# Patient Record
Sex: Female | Born: 1985 | Race: White | Hispanic: No | Marital: Married | Smoking: Never smoker
Health system: Southern US, Community
[De-identification: ages and names within clinical notes are randomized; demographics above are authoritative.]

## PROBLEM LIST (undated history)

## (undated) HISTORY — PX: HAND SURGERY: SHX662

---

## 2012-03-14 ENCOUNTER — Inpatient Hospital Stay: Payer: Self-pay | Admitting: Obstetrics and Gynecology

## 2012-03-14 LAB — CBC WITH DIFFERENTIAL/PLATELET
Basophil #: 0 10*3/uL (ref 0.0–0.1)
Eosinophil #: 0.1 10*3/uL (ref 0.0–0.7)
Eosinophil %: 0.8 %
HGB: 10 g/dL — ABNORMAL LOW (ref 12.0–16.0)
Lymphocyte %: 17 %
MCH: 25.3 pg — ABNORMAL LOW (ref 26.0–34.0)
MCHC: 32.3 g/dL (ref 32.0–36.0)
Monocyte #: 0.6 x10 3/mm (ref 0.2–0.9)
Neutrophil %: 75.8 %
RDW: 14.2 % (ref 11.5–14.5)

## 2012-03-17 LAB — CBC WITH DIFFERENTIAL/PLATELET
Eosinophil #: 0 10*3/uL (ref 0.0–0.7)
Eosinophil %: 0.1 %
Lymphocyte #: 1.7 10*3/uL (ref 1.0–3.6)
MCHC: 33.4 g/dL (ref 32.0–36.0)
MCV: 78 fL — ABNORMAL LOW (ref 80–100)
Monocyte #: 0.9 x10 3/mm (ref 0.2–0.9)
Monocyte %: 8.5 %
Neutrophil %: 75.8 %
Platelet: 275 10*3/uL (ref 150–440)
RBC: 3.55 10*6/uL — ABNORMAL LOW (ref 3.80–5.20)
RDW: 14.2 % (ref 11.5–14.5)
WBC: 11 10*3/uL (ref 3.6–11.0)

## 2012-03-17 LAB — BETA STREP CULTURE(ARMC)

## 2013-06-15 ENCOUNTER — Emergency Department: Payer: Self-pay | Admitting: Emergency Medicine

## 2013-06-15 LAB — COMPREHENSIVE METABOLIC PANEL
Albumin: 3.3 g/dL — ABNORMAL LOW (ref 3.4–5.0)
Alkaline Phosphatase: 71 U/L
Anion Gap: 1 — ABNORMAL LOW (ref 7–16)
BILIRUBIN TOTAL: 0.4 mg/dL (ref 0.2–1.0)
BUN: 11 mg/dL (ref 7–18)
CALCIUM: 8.9 mg/dL (ref 8.5–10.1)
CO2: 27 mmol/L (ref 21–32)
CREATININE: 0.76 mg/dL (ref 0.60–1.30)
Chloride: 105 mmol/L (ref 98–107)
EGFR (African American): >60
Glucose: 110 mg/dL — ABNORMAL HIGH (ref 65–99)
Osmolality: 266 (ref 275–301)
POTASSIUM: 4.3 mmol/L (ref 3.5–5.1)
SGOT(AST): 30 U/L (ref 15–37)
SGPT (ALT): 38 U/L (ref 12–78)
Sodium: 133 mmol/L — ABNORMAL LOW (ref 136–145)
TOTAL PROTEIN: 7.5 g/dL (ref 6.4–8.2)

## 2013-06-15 LAB — URINALYSIS, COMPLETE
BILIRUBIN, UR: NEGATIVE
Glucose,UR: NEGATIVE mg/dL (ref 0–75)
Ketone: NEGATIVE
Nitrite: NEGATIVE
PH: 6 (ref 4.5–8.0)
PROTEIN: NEGATIVE
RBC,UR: 10 /HPF (ref 0–5)
Specific Gravity: 1.004 (ref 1.003–1.030)
Squamous Epithelial: <1
WBC UR: 422 /HPF (ref 0–5)

## 2013-06-15 LAB — CBC
HCT: 36 % (ref 35.0–47.0)
HGB: 12.3 g/dL (ref 12.0–16.0)
MCH: 28.9 pg (ref 26.0–34.0)
MCHC: 34.1 g/dL (ref 32.0–36.0)
MCV: 85 fL (ref 80–100)
PLATELETS: 266 10*3/uL (ref 150–440)
RBC: 4.25 10*6/uL (ref 3.80–5.20)
RDW: 12.6 % (ref 11.5–14.5)
WBC: 9.3 10*3/uL (ref 3.6–11.0)

## 2013-06-17 LAB — URINE CULTURE

## 2014-02-01 LAB — OB RESULTS CONSOLE HGB/HCT, BLOOD
HCT: 39 %
HEMOGLOBIN: 13.1 g/dL

## 2014-02-01 LAB — OB RESULTS CONSOLE ABO/RH: RH Type: NEGATIVE

## 2014-02-01 LAB — OB RESULTS CONSOLE ANTIBODY SCREEN: ANTIBODY SCREEN: NEGATIVE

## 2014-02-01 LAB — OB RESULTS CONSOLE HEPATITIS B SURFACE ANTIGEN: Hepatitis B Surface Ag: NEGATIVE

## 2014-02-01 LAB — OB RESULTS CONSOLE GC/CHLAMYDIA
Chlamydia: NEGATIVE
Gonorrhea: NEGATIVE

## 2014-02-01 LAB — OB RESULTS CONSOLE RUBELLA ANTIBODY, IGM: Rubella: IMMUNE

## 2014-02-01 LAB — OB RESULTS CONSOLE VARICELLA ZOSTER ANTIBODY, IGG: Varicella: IMMUNE

## 2014-02-01 LAB — OB RESULTS CONSOLE RPR: RPR: NONREACTIVE

## 2014-02-01 LAB — OB RESULTS CONSOLE PLATELET COUNT: PLATELETS: 289 10*3/uL

## 2014-02-01 LAB — OB RESULTS CONSOLE HIV ANTIBODY (ROUTINE TESTING): HIV: NONREACTIVE

## 2014-03-11 ENCOUNTER — Encounter: Payer: Self-pay | Admitting: Obstetrics & Gynecology

## 2014-04-23 NOTE — L&D Delivery Note (Signed)
Delivery Note At 2:34 PM a viable female was delivered via Vaginal, Spontaneous Delivery (Presentation: Left Occiput Anterior) APGAR: 8, 9 Infants name: Yolanda Cline   weight 7 lb 6.3 oz (3355 g).   Placenta status: Intact, Spontaneous.   Cord: 3 vessels with the following complications: None.   Anesthesia: Epidural  Episiotomy:  None  Lacerations: Periurethral;Labial Suture Repair: 3.0 vicryl Est. Blood Loss (mL): 200  Mom to postpartum.  Baby to Couplet care / Skin to Skin.  SVD of a live viable female in ROA position over an intact perineum. After delivery of the head, a body cord was noted to be loose and was delivered through. The infant was then placed on the maternal abdomen where the cord was clamped and cut by the fob after pulsation ceased. Apgars were 8/9. The placenta was then delivered via schultz mechanism and was intact with a 3-vessel cord. Pitocin was then started and the uterus was firm with fundal massage. EBL was ~200. The perineum was inspected and was found to be intact. A left periurethral/labial laceration was noted and was repaired with 3-0 vicryl and was hemostatic after repair. Mother and baby are stable and bonding well.   Jannet MantisSubudhi,  Oddie Bottger 09/06/2014, 4:30 PM

## 2014-04-30 IMAGING — US US OB FOLLOW-UP
1 series · 13 of 28 positions shown · non-contrast
Comparison: none

REASON FOR EXAM: Has PPROM at 33 weeks. Please do estimated fetal weight
and AFI
COMMENTS:   LMP: pregnant with LMP07/27/2011

[Series 1: us ob follow-up · 0.28mm/px · 13 of 57 slices shown]
[im 3/57]
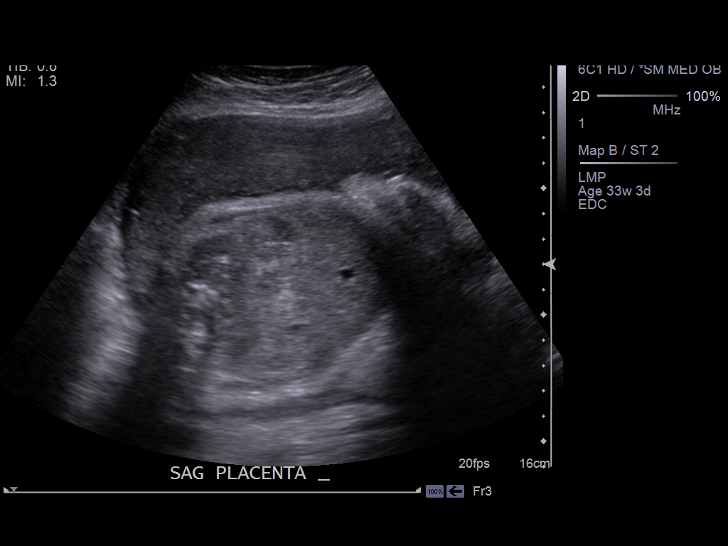
[im 7/57]
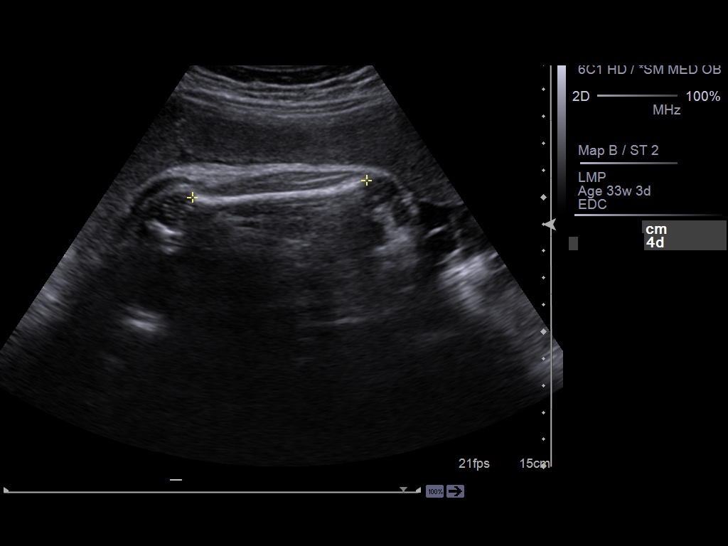
[im 11/57]
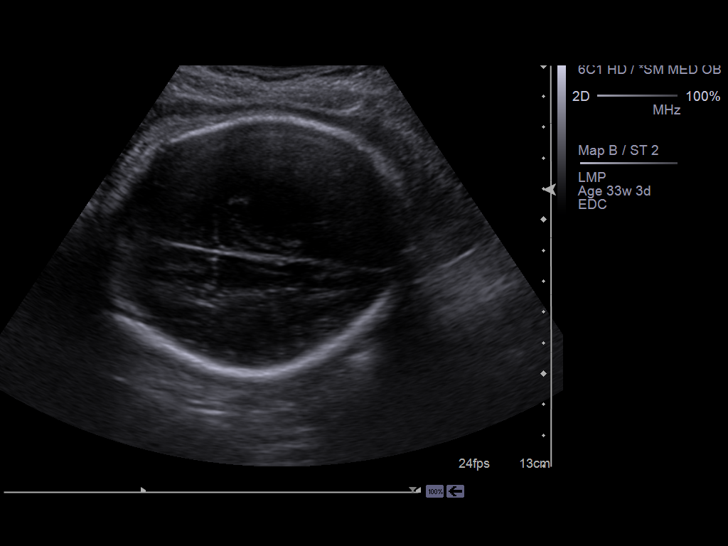
[im 15/57]
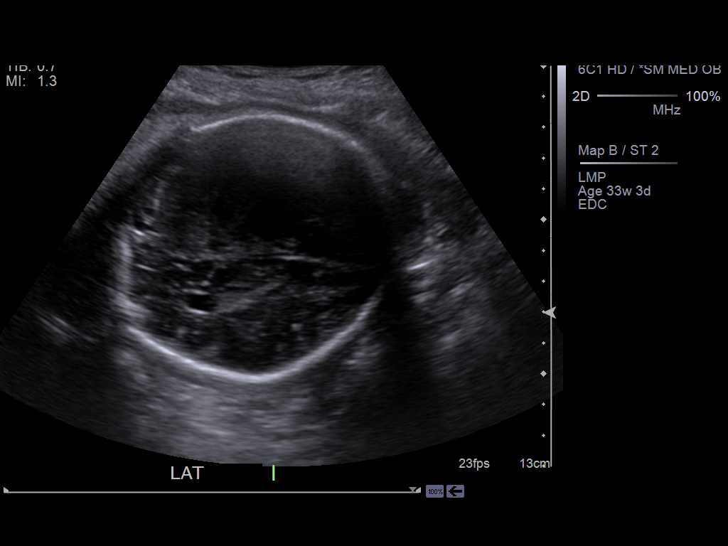
[im 19/57]
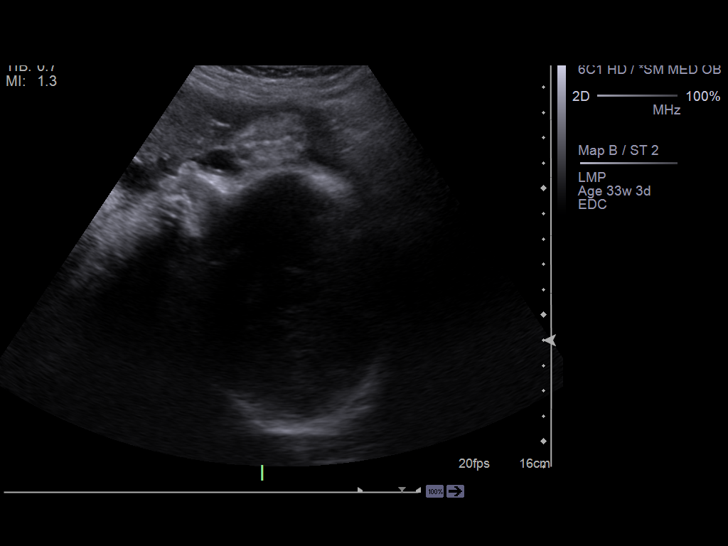
[im 23/57]
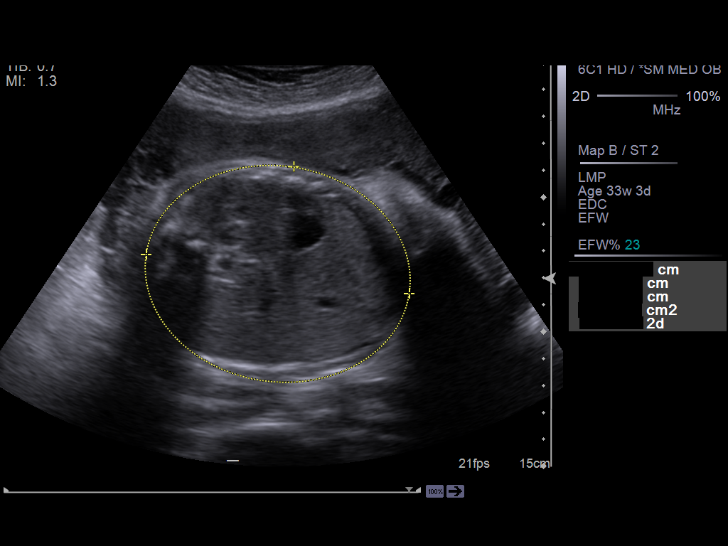
[im 30/57]
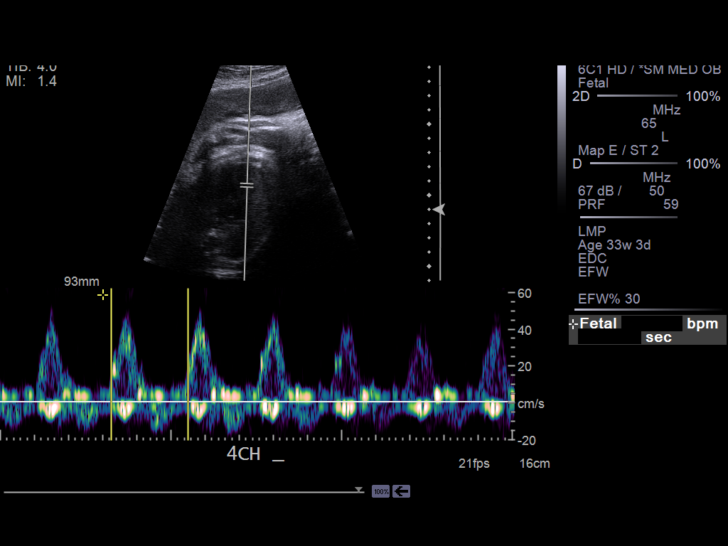
[im 34/57]
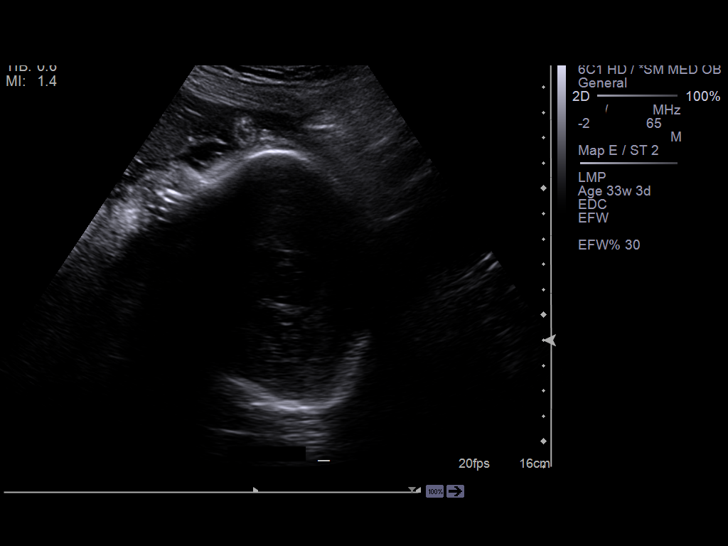
[im 38/57]
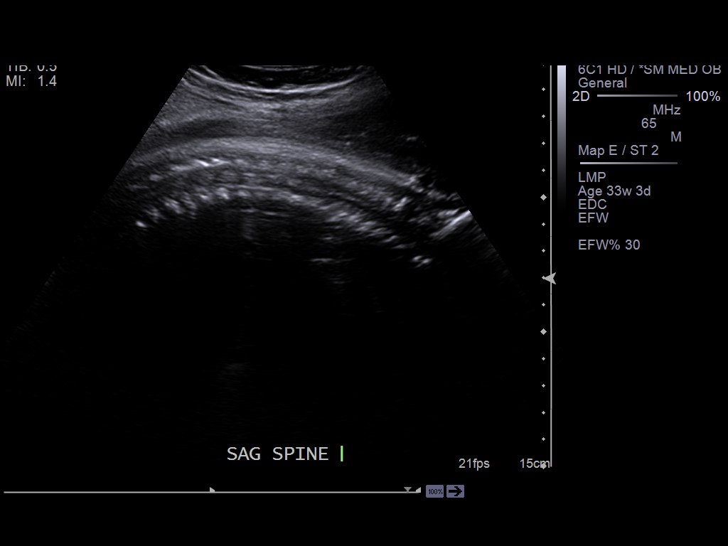
[im 42/57]
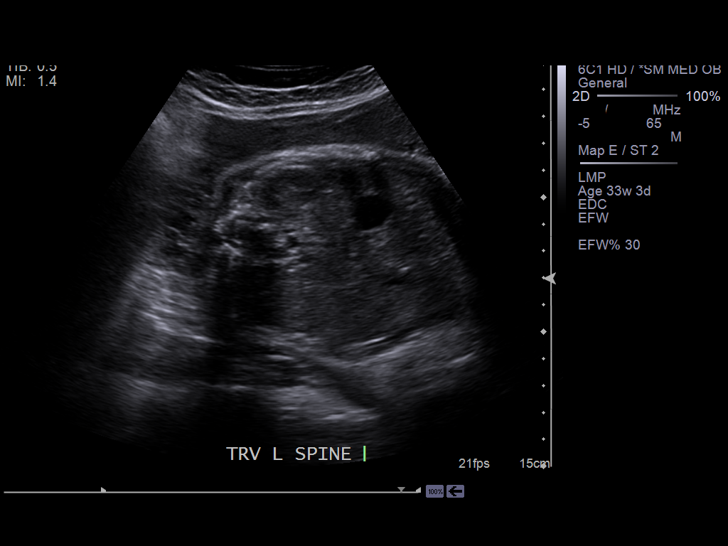
[im 46/57]
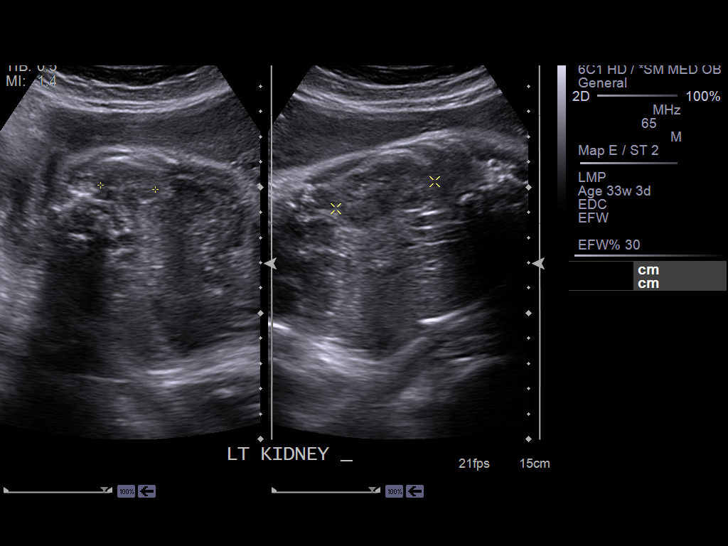
[im 50/57]
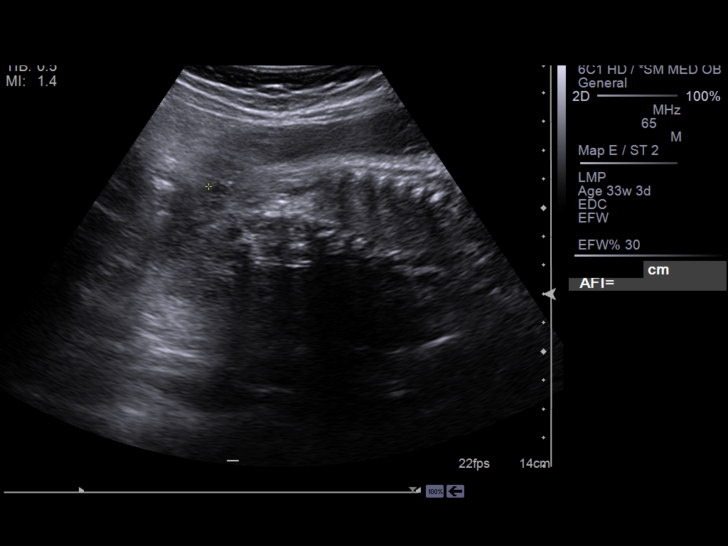
[im 54/57]
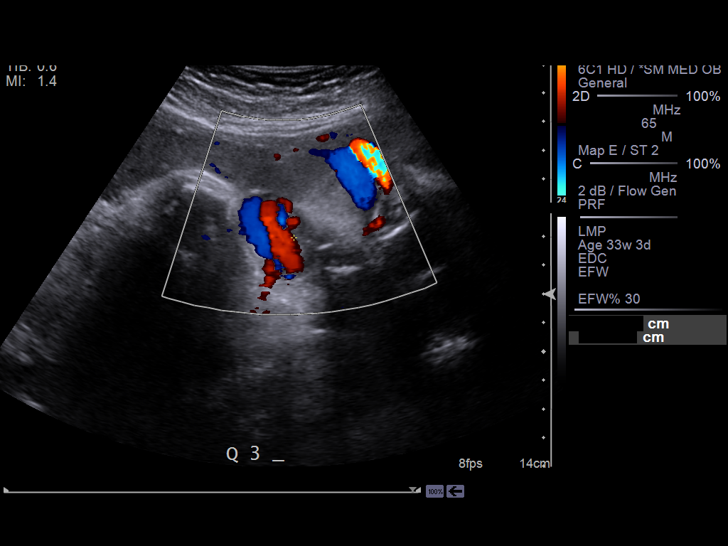

[13 of 28 positions shown; findings below may reference images not displayed]

PROCEDURE:     US  - US OB FOLLOW UP  - March 17, 2012 [DATE]

RESULT:     A limited third trimester ultrasound was performed for
assessment of amniotic fluid index and estimated fetal weight.

The amniotic fluid index measures 2.1 cm. The placenta is anterior and
partially fundal. The distance from the lower placental segment to the
internal cervical os is 9.5 cm. Survey views of fetal anatomy revealed a 4
chambered heart with a rate of 133 beats per minute. The fetal presentation
is cephalic.

Measured parameters:
BPD 8.34 cm corresponding to an EGA of 33 weeks 4 days
HC 29.70 cm corresponding to an EGA of 32 weeks 6 days
AC 28.92 cm corresponding to an EGA of 30 weeks 6 days
FL 6.42 cm corresponding to a 33 week 1 day gestation.
The estimated fetal weight is 3327 grams + / - 312 grams.
IMPRESSION: 1. There is a viable IUP with cephalic presentation. The estimated fetal
weight is 33 weeks 1 day + / - 21 days. The estimated date of confinement is
May 04, 2012. The estimated fetal weight is 3327 grams + / - 312 grams.
2. The amniotic fluid index is 2.1 cm.

[REDACTED]

## 2014-08-10 NOTE — Consult Note (Signed)
Gravida 2    Para 0    Term Deliveries 0    Preterm Deliveries 0    Abortions 1    Living Children 0    Final EDD (dd-mmm-yy) 02-May-2012    Blood Type (Maternal) O negative    Antibody Screen Results (Maternal) positive    HIV Results (Maternal) negative    Gonorrhea Results (Maternal) negative    Chlamydia Results (Maternal) negative    Hepatitis C Culture (Maternal) unknown    Herpes Results (Maternal) n/a    VDRL/RPR/Syphilis Results (Maternal) negative    Rubella Results (Maternal) immune    Hepatitis B Surface Antigen Results (Maternal) negative    Group B Strep Results Maternal (Result >5wks must be treated as unknown) unknown/result > 5 weeks ago  results pending, sent 11/22 at 1000am    Prenatal Care Adequate     Additional Comments This is a G1 mother at [redacted] wks GA with PROM and PTL. She is being treated with antibiotics and BMS I met woth both to be mom and dad and discussed at length about complications of prematurity at 33-[redacted] wks GA such as Respiratory Distress Symdrome, Feeding problems,. risk of sepsis / meningitis wit PROM, Need for IV fluids , PIV and OG feeds and also risk of jaundice. I also discussed about possible hospital length of stay for the baby. His name will be "Malachai"  I answered all their questions and offered to come back if any questions / anxiety arises.   Time of consultation ~25 min for face to face encounter adn reviewing the chart  Thank you for the consultation    Parental Contact Parents informed at length regarding prenatal care and plan   Electronic Signatures: Darreld Mclean (MD)  (Signed [phone] 17:01)  Authored: PREGNANCY and LABOR, ADDITIONAL COMMENTS   Last Updated: 22-Nov-13 17:01 by Darreld Mclean (MD)

## 2014-08-14 NOTE — Consult Note (Signed)
Referral Information:  Reason for Referral Currently pregnant. History of PPROM   Referring Physician [facility]   Prenatal Hx Yolanda Cline is a 29 year-old G3 P0111 at 66 1/7 weeks who presents for recommendations in this pregnancy.  In 2013, she had SROM at 33 weeks without signs of labor. Prior to that her pregnancy had been uncomplicated. She received antenatal corticosteroids and latency antibiotics. At 34 weeks, [facility] ICN was full so she was transferred to [facility] in [place] where she underwent an uncomplicated labor induction with pitocin and spontaneous vaginal delivery.  Her son weighed 5 pounds 3 ounces and was in the ICN for 8 days.  He is doing well.   She denies any complicatiosn with the current pregnancy. She is aware of 17P and has questions.  Denies vaginal bleeding.   Past Obstetrical Hx See above.  G3 [id number] 2012: First trimester SAB, treated with misoprostol. No heart beat ever seen 2013: PPROM at 33 wks with labor induction and spontaneous vaginal delivery at 34 weeks.  Unknown placental pathology.   Allergies:   No Known Allergies:   Vital Signs/Notes:  Nursing Vital Signs: **Vital Signs.:   19-Nov-15 15:02  Pulse Pulse 93  Systolic BP Systolic BP 108  Diastolic BP (mmHg) Diastolic BP (mmHg) 68   Perinatal Consult:  PGyn Hx Remote history of abnormal pap but no cervical procedures. 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   Fever/Chills No   Cough No   Abdominal Pain No   SOB/DOE No   Medications/Allergies Reviewed Medications/Allergies reviewed  taking Prenatal vitamins only    Additional Lab/Radiology Notes Prenatal labs ([facility] 02/01/14): Blood type O negative, antbody screen  negative, Varicella immune, Rubella immune, RPR non-reactive, Hep B neg, HIV non-reactive, hct 39.4, MCV 86, Plt 289, Urine culture no growth. GC/Chl Negative  First trimester screen ([facility] 03/02/14) Screen risk for Down Syndrome less than 1 in 10,000 Screen risk for Trisomy 18 less than 1 in 10,000   Impression/Recommendations:  Impression 29 year-old G3 P0111 at 73 1/7 weeks with history of prior pregnancy complicated by PPROM at 33 weeks and elective induction of labor at 34 weeks.   Recommendations We discussed the recurrence risk of preterm birth among women with prior preterm birth. We discussed the MFMU Meis trial and that she would be a candidate for 17P therapy.  Risks/benefits discussed.  Rec: -Initiate 17P at 15-16 weeks and continue weekly until 36 weeks -Cervical length assessment at time of anatomy scan at 18 weeks. Repeat cervical length screening at lease weekly until 28 weeks. Please contact us should her cervical length become less than 2.5 cm -We discussed signs and symptoms of preterm labor.    Total Time Spent with Patient 60 minutes   >50% of visit spent in couseling/coordination of care yes   Office Use Only 99244  Level 4 ( ) NEW office consult low complexity   Coding Description: OTHER: History of preterm birth due to PPROM.  Electronic Signatures for Addendum Section:  Laramie Meissner, [place] (MD) (Signed Addendum 22-Feb-16 10:50)  Append: Cervical length screening can be done every 1-2 weeks until 28 weeks.   Electronic Signatures: Tiah Heckel, [place] (MD)  (Signed [phone] 16:04)  Authored:  Referral, Home Medications, Allergies, Vital Signs/Notes, Consult, Exam, Lab/Radiology Notes, Impression, Billing, Coding Description   Last Updated: 22-Feb-16 10:50 by Doren Kaspar, [place] (MD)

## 2014-08-23 LAB — OB RESULTS CONSOLE GBS: STREP GROUP B AG: NEGATIVE

## 2014-08-31 NOTE — H&P (Signed)
L&D Evaluation:  History:   HPI 29 yo G2P0010 at 5432w0d gestational age who presents from clinic with grossly ruptured membranes confirmed ruptured in clinic with +pool, +nitrazine, +ferning.  She notes she had gush of clear fluid this morning.  She presented to a previously scheduled appt for assessment with the above-noted findings.  Her pregnancy has been uncomplicated so far.  She notes positive fetal movement, no vaginal bleeding, and only occasional contractions.  O neg, RPR NR, HIV neg, HBsAg neg, RI, VZI.  GBS unknown    Patient's Medical History No Chronic Illness    Patient's Surgical History hand surgery    Medications Pre Natal Vitamins    Allergies NKDA    Social History none    Family History Non-Contributory   ROS:   ROS All systems were reviewed.  HEENT, CNS, GI, GU, Respiratory, CV, Renal and Musculoskeletal systems were found to be normal., unless noted in HPI   Exam:   Vital Signs stable    General no apparent distress    Mental Status clear    Chest clear    Heart normal sinus rhythm    Abdomen gravid, non-tender    Estimated Fetal Weight Average for gestational age    Fetal Position cephalic by bedside u/s    Back no CVAT    Edema no edema    Pelvic no external lesions, closed by inspection at office this AM    Mebranes Ruptured    Description clear    FHT normal rate with no decels    FHT Description 140/mod var/+accels/no decels   Impression:   Impression PPROM, reactive NST   Plan:   Plan EFM/NST, monitor contractions and for cervical change    Comments - Will give betamethasone for fetal lung maturity - latency antibiotics start today.  Ampicillin 2g Q6 hours x 48 hours, Erythromycin 250mg  Q6h x 48 hours (IV antibiotics for the first two days).  Will switch to PO amoxicillin and erythromycin days 3-7.   - GBS collected in clinic today, will send to lab. - neonatology consultation -expectant management until 34 weeks, then will  induce - will monitor for signs/symptoms of chorioamnionitis, abruption, labor.  - spent extended period of time with patient and her husband discussing plan of management.  All questions answered. - Will no tocolize if labors.   Electronic Signatures: Conard NovakJackson, Aladdin Kollmann D (MD)  (Signed [phone]-Nov-13 20:42)  Authored: L&D Evaluation   Last Updated: 24-Nov-13 20:42 by Conard NovakJackson, Camil Wilhelmsen D (MD)

## 2014-09-05 ENCOUNTER — Inpatient Hospital Stay
Admission: EM | Admit: 2014-09-05 | Discharge: 2014-09-07 | DRG: 775 | Disposition: A | Discharge status: Home / Self Care | Payer: BC Managed Care – PPO | Attending: Obstetrics and Gynecology | Admitting: Obstetrics and Gynecology

## 2014-09-05 DIAGNOSIS — Z3A38 38 weeks gestation of pregnancy: Secondary | ICD-10-CM | POA: Diagnosis present

## 2014-09-05 DIAGNOSIS — Z3483 Encounter for supervision of other normal pregnancy, third trimester: Secondary | ICD-10-CM | POA: Diagnosis present

## 2014-09-05 DIAGNOSIS — O479 False labor, unspecified: Secondary | ICD-10-CM

## 2014-09-05 LAB — TYPE AND SCREEN
ABO/RH(D): O NEG
Antibody Screen: NEGATIVE

## 2014-09-05 LAB — RAPID HIV SCREEN (HIV 1/2 AB+AG)
HIV 1/2 ANTIBODIES: NONREACTIVE
HIV-1 P24 ANTIGEN - HIV24: NONREACTIVE

## 2014-09-05 LAB — CBC
HEMATOCRIT: 30.8 % — AB (ref 35.0–47.0)
Hemoglobin: 9.8 g/dL — ABNORMAL LOW (ref 12.0–16.0)
MCH: 24.2 pg — ABNORMAL LOW (ref 26.0–34.0)
MCHC: 31.9 g/dL — ABNORMAL LOW (ref 32.0–36.0)
MCV: 75.8 fL — AB (ref 80.0–100.0)
Platelets: 221 10*3/uL (ref 150–440)
RBC: 4.06 MIL/uL (ref 3.80–5.20)
RDW: 16.6 % — ABNORMAL HIGH (ref 11.5–14.5)
WBC: 9.4 10*3/uL (ref 3.6–11.0)

## 2014-09-05 MED ORDER — OXYTOCIN 40 UNITS IN LACTATED RINGERS INFUSION - SIMPLE MED
INTRAVENOUS | Status: AC
Start: 2014-09-05 — End: 2014-09-06
  Filled 2014-09-05: qty 1000

## 2014-09-05 MED ORDER — ACETAMINOPHEN 325 MG PO TABS: 650.0000 mg | ORAL_TABLET | ORAL | Status: DC | PRN

## 2014-09-05 MED ORDER — BUTORPHANOL TARTRATE 1 MG/ML IJ SOLN
2.0000 mg | INTRAMUSCULAR | Status: DC | PRN
  Administered 2014-09-06: 2 mg via INTRAVENOUS

## 2014-09-05 MED ORDER — OXYTOCIN 40 UNITS IN LACTATED RINGERS INFUSION - SIMPLE MED: 62.5000 mL/h | INTRAVENOUS | Status: DC

## 2014-09-05 MED ORDER — LACTATED RINGERS IV SOLN: INTRAVENOUS | Status: DC

## 2014-09-05 MED ORDER — OXYTOCIN BOLUS FROM INFUSION
500.0000 mL | INTRAVENOUS | Status: DC
  Administered 2014-09-06: 500 mL via INTRAVENOUS

## 2014-09-05 MED ORDER — SODIUM CHLORIDE 0.9 % IJ SOLN
INTRAMUSCULAR | Status: AC
  Administered 2014-09-05: 3 mL via INTRAVENOUS
  Filled 2014-09-05: qty 50

## 2014-09-05 MED ORDER — LACTATED RINGERS IV SOLN: 500.0000 mL | INTRAVENOUS | Status: DC | PRN

## 2014-09-05 MED ORDER — SODIUM CHLORIDE 0.9 % IJ SOLN
3.0000 mL | INTRAMUSCULAR | Status: DC | PRN
  Administered 2014-09-05: 3 mL via INTRAVENOUS
  Filled 2014-09-05: qty 10

## 2014-09-05 NOTE — H&P (Signed)
Obstetric History and Physical  Yolanda Cline is a 29 y.o. [phone] with Estimated Date of Delivery: 09/16/14 per LMP and 7 wk US who presents at 5218w3d  presenting for contractions. Patient states she has been having  regular, every 5 minutes contractions, no vaginal bleeding, intact membranes, with active fetal movement.    Prenatal Course Source of Care: [facility]  with onset of care at 7 weeks Pregnancy complications or risks: Patient Active Problem List   Diagnosis Date Noted  . Irregular contractions 09/05/2014  . Labor and delivery, indication for care 09/05/2014   She plans to breastfeed She desires unknown for postpartum contraception.   Prenatal labs and studies: ABO, Rh: O neg Antibody: neg Rubella: Immune Varicella: Immune RPR:  NR HBsAg:  Neg HIV: neg GC/CT: neg/neg GBS: neg 1 hr Glucola: 137   Genetic screening: 1st trimester negative     Prenatal Transfer Tool   No past medical history on file.  Past Surgical History  Procedure Laterality Date  . Hand surgery      OB History  Gravida Para Term Preterm AB SAB TAB Ectopic Multiple Living  3 1  1 1 1    1     # Outcome Date GA Lbr Len/2nd Weight Sex Delivery Anes PTL Lv  3 Current           2 Preterm 03/20/12 692w0d  2.353 kg (5 lb 3 oz) M Vag-Spont None Y Y  1 SAB 09/06/10            Obstetric Comments  H/O preterm labor with 2nd pregnancy.    History   Social History  . Marital Status: Married    Spouse Name: N/A  . Number of Children: N/A  . Years of Education: N/A   Social History Main Topics  . Smoking status: Never Smoker   . Smokeless tobacco: Not on file  . Alcohol Use: No  . Drug Use: No  . Sexual Activity: Yes   Other Topics Concern  . None   Social History Narrative  . None    Family History  Problem Relation Age of Onset  . Cancer Maternal Grandmother   . Hypertension Maternal Grandmother   . Hypertension Paternal Grandfather     Prescriptions prior to admission   Medication Sig Dispense Refill Last Dose  . Prenatal Vit-Fe Fumarate-FA (PRENATAL MULTIVITAMIN) TABS tablet Take 1 tablet by mouth daily at 12 noon.       No Known Allergies  Review of Systems: Negative except for what is mentioned in HPI.  Physical Exam: BP 126/86 mmHg  Pulse 115  Temp(Src) 97.7 F (36.5 C) (Oral)  Resp 18  Ht 5' (1.524 m)  Wt 68.493 kg (151 lb)  BMI 29.49 kg/m2  LMP 12/10/2013 GENERAL: Well-developed, well-nourished female in no acute distress.  LUNGS: Clear to auscultation bilaterally.  HEART: Regular rate and rhythm. ABDOMEN: Soft, nontender, nondistended, gravid. EXTREMITIES: Nontender, no edema Cervical Exam: Dilatation 4.5 cm   Effacement 90 %   Station 0 to -1, initially 2 cm on admission, 4 cm at most recent exam at  1800  Presentation: cephalic FHT: Category:1 Baseline rate 130 bpm   Variability moderate  Accelerations present   Decelerations none Contractions: Every 6-7 mins   Pertinent Labs/Studies:   Results for orders placed or performed during the hospital encounter of 09/05/14 (from the past 24 hour(s))  CBC     Status: Abnormal   Collection Time: 09/05/14  8:10 PM  Result Value  Ref Range   WBC 9.4 3.6 - 11.0 K/uL   RBC 4.06 3.80 - 5.20 MIL/uL   Hemoglobin 9.8 (L) 12.0 - 16.0 g/dL   HCT 16.130.8 (L) 09.635.0 - 04.547.0 %   MCV 75.8 (L) 80.0 - 100.0 fL   MCH 24.2 (L) 26.0 - 34.0 pg   MCHC 31.9 (L) 32.0 - 36.0 g/dL   RDW 40.916.6 (H) 81.111.5 - 91.414.5 %   Platelets 221 150 - 440 K/uL  Rapid HIV screen (HIV 1/2 Ab+Ag)     Status: None   Collection Time: 09/05/14  8:10 PM  Result Value Ref Range   HIV-1 P24 Antigen - HIV24 NON REACTIVE NON REACTIVE   HIV 1/2 Antibodies NON REACTIVE NON REACTIVE   Interpretation (HIV Ag Ab)      A non reactive test result means that HIV 1 or HIV 2 antibodies and HIV 1 p24 antigen were not detected in the specimen.  Type and screen     Status: None (Preliminary result)   Collection Time: 09/05/14  8:10 PM  Result Value Ref  Range   ABO/RH(D) PENDING    Antibody Screen PENDING    Sample Expiration 09/08/2014     Assessment : IUP at 7463w3d being admitted for labor.  Plan: Continue monitoring for labor progress Reviewed expecting management at 38 weeks.  Intermittent monitoring

## 2014-09-05 NOTE — OB Triage Note (Signed)
29 y.o. female presents today with complaint of contractions since this afternoon .  States that this started around 1245. This has been going on for a few hours where her ctx have been 5 minutes apart. States that this pain ranges from a 2 /10 to a 6/10 on pain scale. She states that nothing makes it better and walking makes it worse. At home has tried a warm shower to make it better with no success.  At her last office visit on Friday she was checked by Dr. Vergie LivingPickens and told she was 1/50/-1.  Has a h/o preterm delivery with her first pregnancy and when she started into labor it went rather quickly, so she is concerned about going quickly this time.

## 2014-09-05 NOTE — Progress Notes (Signed)
L&D Note  09/05/2014 - 11:35 PM  29 y.o. [id number] 3293w3d   [name] is admitted for labor   Subjective:  Reports contractions increasing in intensity  Objective:   Filed Vitals:   09/05/14 1544 09/05/14 1605 09/05/14 1606 09/05/14 1929  BP:  116/75  126/86  Pulse:  123 118 115  Temp:  98.4 F (36.9 C)  97.7 F (36.5 C)  TempSrc:  Oral  Oral  Resp:  16  18  Height: 5' (1.524 m)     Weight: 68.493 kg (151 lb)       Current Vital Signs 24h Vital Sign Ranges  T 97.7 F (36.5 C) Temp  Avg: 98.1 F (36.7 C)  Min: 97.7 F (36.5 C)  Max: 98.4 F (36.9 C)  BP 126/86 mmHg BP  Min: 116/75  Max: 126/86  HR (!) 115 Pulse  Avg: 118.7  Min: 115  Max: 123  RR 18 Resp  Avg: 17  Min: 16  Max: 18  SaO2     No Data Recorded       24 Hour I/O Current Shift I/O  Time Ins Outs        FHR: baseline 115, mod var, + accels Toco: not graphing well SVE: 5/90/-1   Assessment :  IUP at 6793w3d in labor    Plan:  Continue expectant management Stadol then epidural when desired Anticipate vaginal delivery   Marta AntuBrothers, Marielouise Amey, [place]

## 2014-09-06 ENCOUNTER — Encounter: Payer: Self-pay | Admitting: *Deleted

## 2014-09-06 ENCOUNTER — Inpatient Hospital Stay: Payer: BC Managed Care – PPO | Admitting: Anesthesiology

## 2014-09-06 LAB — ABO/RH: ABO/RH(D): O NEG

## 2014-09-06 MED ORDER — DIBUCAINE 1 % RE OINT: 1.0000 "application " | TOPICAL_OINTMENT | RECTAL | Status: DC | PRN

## 2014-09-06 MED ORDER — OXYCODONE-ACETAMINOPHEN 5-325 MG PO TABS: 2.0000 | ORAL_TABLET | ORAL | Status: DC | PRN

## 2014-09-06 MED ORDER — OXYTOCIN 40 UNITS IN LACTATED RINGERS INFUSION - SIMPLE MED
1.0000 m[IU]/min | INTRAVENOUS | Status: DC
  Administered 2014-09-06: 1 m[IU]/min via INTRAVENOUS

## 2014-09-06 MED ORDER — BENZOCAINE-MENTHOL 20-0.5 % EX AERO: 1.0000 "application " | INHALATION_SPRAY | CUTANEOUS | Status: DC | PRN

## 2014-09-06 MED ORDER — ONDANSETRON HCL 4 MG/2ML IJ SOLN: 4.0000 mg | INTRAMUSCULAR | Status: DC | PRN

## 2014-09-06 MED ORDER — BUTORPHANOL TARTRATE 1 MG/ML IJ SOLN
INTRAMUSCULAR | Status: AC
  Administered 2014-09-06: 2 mg via INTRAVENOUS
  Filled 2014-09-06: qty 2

## 2014-09-06 MED ORDER — BENZOCAINE-MENTHOL 20-0.5 % EX AERO
INHALATION_SPRAY | CUTANEOUS | Status: AC
  Filled 2014-09-06: qty 56

## 2014-09-06 MED ORDER — ROPIVACAINE HCL 2 MG/ML IJ SOLN: 10.0000 mL/h | INTRAMUSCULAR | Status: DC

## 2014-09-06 MED ORDER — CALCIUM CARBONATE ANTACID 500 MG PO CHEW: 500.0000 mg | CHEWABLE_TABLET | ORAL | Status: DC | PRN

## 2014-09-06 MED ORDER — OXYCODONE-ACETAMINOPHEN 5-325 MG PO TABS: 1.0000 | ORAL_TABLET | ORAL | Status: DC | PRN

## 2014-09-06 MED ORDER — SENNOSIDES-DOCUSATE SODIUM 8.6-50 MG PO TABS
2.0000 | ORAL_TABLET | ORAL | Status: DC
  Administered 2014-09-07: 2 via ORAL
  Filled 2014-09-06: qty 2

## 2014-09-06 MED ORDER — METHYLERGONOVINE MALEATE 0.2 MG/ML IJ SOLN
0.2000 mg | INTRAMUSCULAR | Status: DC | PRN
  Filled 2014-09-06: qty 1

## 2014-09-06 MED ORDER — TERBUTALINE SULFATE 1 MG/ML IJ SOLN: 0.2500 mg | Freq: Once | INTRAMUSCULAR | Status: DC | PRN

## 2014-09-06 MED ORDER — PRENATAL MULTIVITAMIN CH
1.0000 | ORAL_TABLET | Freq: Every day | ORAL | Status: DC
  Administered 2014-09-07: 1 via ORAL
  Filled 2014-09-06: qty 1

## 2014-09-06 MED ORDER — FENTANYL 2.5 MCG/ML W/ROPIVACAINE 0.2% IN NS 100 ML EPIDURAL INFUSION (ARMC-ANES)
EPIDURAL | Status: AC
  Filled 2014-09-06: qty 100

## 2014-09-06 MED ORDER — OXYTOCIN 40 UNITS IN LACTATED RINGERS INFUSION - SIMPLE MED: 62.5000 mL/h | Freq: Once | INTRAVENOUS | Status: DC

## 2014-09-06 MED ORDER — IBUPROFEN 200 MG PO TABS
600.0000 mg | ORAL_TABLET | Freq: Four times a day (QID) | ORAL | Status: DC | PRN
  Administered 2014-09-06 – 2014-09-07 (×4): 600 mg via ORAL
  Filled 2014-09-06: qty 3

## 2014-09-06 MED ORDER — WITCH HAZEL-GLYCERIN EX PADS: 1.0000 "application " | MEDICATED_PAD | CUTANEOUS | Status: DC | PRN

## 2014-09-06 MED ORDER — BUPIVACAINE HCL (PF) 0.25 % IJ SOLN
INTRAMUSCULAR | Status: DC | PRN
  Administered 2014-09-06: 10 mL

## 2014-09-06 MED ORDER — LANOLIN HYDROUS EX OINT: TOPICAL_OINTMENT | CUTANEOUS | Status: DC | PRN

## 2014-09-06 MED ORDER — ONDANSETRON HCL 4 MG PO TABS: 4.0000 mg | ORAL_TABLET | ORAL | Status: DC | PRN

## 2014-09-06 MED ORDER — ACETAMINOPHEN 325 MG PO TABS: 650.0000 mg | ORAL_TABLET | ORAL | Status: DC | PRN

## 2014-09-06 MED ORDER — TETANUS-DIPHTH-ACELL PERTUSSIS 5-2.5-18.5 LF-MCG/0.5 IM SUSP
0.5000 mL | Freq: Once | INTRAMUSCULAR | Status: DC
  Filled 2014-09-06: qty 0.5

## 2014-09-06 MED ORDER — SIMETHICONE 80 MG PO CHEW: 80.0000 mg | CHEWABLE_TABLET | ORAL | Status: DC | PRN

## 2014-09-06 MED ORDER — FENTANYL 2.5 MCG/ML W/ROPIVACAINE 0.2% IN NS 100 ML EPIDURAL INFUSION (ARMC-ANES)
10.0000 mL/h | EPIDURAL | Status: DC
  Administered 2014-09-06: 10 mL/h via EPIDURAL

## 2014-09-06 MED ORDER — ZOLPIDEM TARTRATE 5 MG PO TABS: 5.0000 mg | ORAL_TABLET | Freq: Every evening | ORAL | Status: DC | PRN

## 2014-09-06 MED ORDER — METHYLERGONOVINE MALEATE 0.2 MG PO TABS: 0.2000 mg | ORAL_TABLET | ORAL | Status: DC | PRN

## 2014-09-06 MED ORDER — DIPHENHYDRAMINE HCL 25 MG PO CAPS: 25.0000 mg | ORAL_CAPSULE | Freq: Four times a day (QID) | ORAL | Status: DC | PRN

## 2014-09-06 MED ORDER — LIDOCAINE-EPINEPHRINE (PF) 1.5 %-1:200000 IJ SOLN
INTRAMUSCULAR | Status: DC | PRN
  Administered 2014-09-06: 3 mL

## 2014-09-06 NOTE — Anesthesia Procedure Notes (Signed)
Epidural Patient location during procedure: OB Start time: 09/06/2014 9:40 AM End time: 09/06/2014 9:48 AM  Staffing Anesthesiologist: Yves DillARROLL, Teancum Brule  Preanesthetic Checklist Completed: patient identified, site marked, surgical consent, pre-op evaluation, timeout performed, IV checked, risks and benefits discussed and monitors and equipment checked  Epidural Patient position: sitting Prep: Betadine Patient monitoring: heart rate, cardiac monitor, continuous pulse ox and blood pressure Approach: midline Location: L3-L4 Injection technique: LOR air  Needle:  Needle type: Tuohy  Needle gauge: 18 G Needle length: 9 cm Needle insertion depth: 5 cm Catheter type: closed end flexible Catheter size: 20 Guage Test dose: negative and 1.5% lidocaine with Epi 1:200 K  Assessment Sensory level: T8  Additional Notes Reason for block:procedure for pain

## 2014-09-06 NOTE — Progress Notes (Signed)
L&D Note  09/06/2014 - 8:40 AM  29 y.o. [id number] 2035w4d here for labor. Pregnancy complicated by history of preterm birth and was on 17-p.   Patient Active Problem List   Diagnosis Date Noted  . Irregular contractions 09/05/2014  . Labor and delivery, indication for care 09/05/2014    [name] is admitted for labor   Subjective:  Pt reports having painful contractions- received stadol recently for pain.   Objective:   Filed Vitals:   09/06/14 0039 09/06/14 0222 09/06/14 0520 09/06/14 0733  BP: 122/81 114/79 137/81 117/73  Pulse: 99 94 94 98  Temp:    98.1 F (36.7 C)  TempSrc:    Oral  Resp:    16  Height:      Weight:        Current Vital Signs 24h Vital Sign Ranges  T 98.1 F (36.7 C) Temp  Avg: 98.1 F (36.7 C)  Min: 97.7 F (36.5 C)  Max: 98.4 F (36.9 C)  BP 117/73 mmHg BP  Min: 114/79  Max: 137/81  HR 98 Pulse  Avg: 106.9  Min: 94  Max: 123  RR 16 Resp  Avg: 17  Min: 16  Max: 18  SaO2     No Data Recorded       24 Hour I/O Current Shift I/O  Time Ins Outs        FHR: 125, moderate variability, +accels, no decels  Toco: Q6 min SVE: last exam- Dilation: 5 Effacement (%): 90 Cervical Position: Anterior Station: -1 Presentation: Vertex Exam by:: Brothers CNM Labs:   Recent Labs Lab 09/05/14 2010  WBC 9.4  HGB 9.8*  HCT 30.8*  PLT 221   No results for input(s): NA, K, CL, CO2, BUN, CREATININE, CALCIUM, PROT, BILITOT, ALKPHOS, ALT, AST, GLUCOSE in the last 168 hours.  Invalid input(s): LABALBU  Medications   Medication List    ASK your doctor about these medications        prenatal multivitamin Tabs tablet  Take 1 tablet by mouth daily at 12 noon.       Assessment & Plan:   *IUP: 5635w4d admitted for labor *GBS: negative *Analgesia: stadol currently- plans epidural for pain relief  Jannet Mantisourtney Keziah Avis, CNM [facility]

## 2014-09-06 NOTE — Anesthesia Preprocedure Evaluation (Signed)
Anesthesia Evaluation  Patient identified by MRN, date of birth, ID band Patient awake    Airway Mallampati: II  TM Distance: >3 FB Neck ROM: Full    Dental no notable dental hx.    Pulmonary neg pulmonary ROS,    Pulmonary exam normal       Cardiovascular negative cardio ROS Normal cardiovascular exam    Neuro/Psych    GI/Hepatic negative GI ROS, Neg liver ROS,   Endo/Other  negative endocrine ROS  Renal/GU negative Renal ROS  negative genitourinary   Musculoskeletal negative musculoskeletal ROS (+)   Abdominal Normal abdominal exam  (+)   Peds negative pediatric ROS (+)  Hematology negative hematology ROS (+)   Anesthesia Other Findings   Reproductive/Obstetrics (+) Pregnancy                             Anesthesia Physical Anesthesia Plan  ASA: II  Anesthesia Plan: Epidural   Post-op Pain Management:    Induction: Intravenous  Airway Management Planned:   Additional Equipment:   Intra-op Plan:   Post-operative Plan:   Informed Consent: I have reviewed the patients History and Physical, chart, labs and discussed the procedure including the risks, benefits and alternatives for the proposed anesthesia with the patient or authorized representative who has indicated his/her understanding and acceptance.   Dental advisory given  Plan Discussed with: Surgeon  Anesthesia Plan Comments:         Anesthesia Quick Evaluation

## 2014-09-06 NOTE — Progress Notes (Signed)
Delivered SVD.   Yolanda Cline

## 2014-09-06 NOTE — Progress Notes (Signed)
L&D Note  09/06/2014 - 5:39 AM  29 y.o. [id number] 839w4d   Ms. Altamease Oilerlizabeth A Couzens is admitted for labor   Subjective:  Pt has continue to have regular contractions, periods of irregular and regular contractions   Objective:   Filed Vitals:   09/05/14 2104 09/05/14 2236 09/06/14 0039 09/06/14 0222  BP: 137/73 127/56 122/81 114/79  Pulse: 107 114 99 94  Temp:  98.2 F (36.8 C)    TempSrc:  Oral    Resp:  18    Height:      Weight:        Current Vital Signs 24h Vital Sign Ranges  T 98.2 F (36.8 C) Temp  Avg: 98.1 F (36.7 C)  Min: 97.7 F (36.5 C)  Max: 98.4 F (36.9 C)  BP 114/79 mmHg BP  Min: 114/79  Max: 137/73  HR 94 Pulse  Avg: 110  Min: 94  Max: 123  RR 18 Resp  Avg: 17.3  Min: 16  Max: 18  SaO2     No Data Recorded       24 Hour I/O Current Shift I/O  Time Ins Outs        FHR: 130, mod variability, + accels, no decels Toco: toco just reapplied SVE: 5/90/-1   Assessment :  IUP at 4039w4d, contractions    Plan:  Discussion with pt and s/o re: options at this point. Given that she has made cervical change since admission, I feel like it would be reasonable to AROM to help progress labor. However, given that she is in her 38th week, continued expectant management could be warranted also. Mutual decision made to proceed with AROM. Clear fluid noted.   Marta AntuBrothers, Jonah Nestle, [place]

## 2014-09-07 LAB — CBC
HEMATOCRIT: 28.1 % — AB (ref 35.0–47.0)
Hemoglobin: 9.1 g/dL — ABNORMAL LOW (ref 12.0–16.0)
MCH: 24.2 pg — ABNORMAL LOW (ref 26.0–34.0)
MCHC: 32.4 g/dL (ref 32.0–36.0)
MCV: 74.7 fL — AB (ref 80.0–100.0)
Platelets: 199 10*3/uL (ref 150–440)
RBC: 3.77 MIL/uL — ABNORMAL LOW (ref 3.80–5.20)
RDW: 16.4 % — ABNORMAL HIGH (ref 11.5–14.5)
WBC: 12 10*3/uL — AB (ref 3.6–11.0)

## 2014-09-07 LAB — RPR: RPR Ser Ql: NONREACTIVE

## 2014-09-07 MED ORDER — DOCUSATE SODIUM 100 MG PO CAPS: 100.0000 mg | ORAL_CAPSULE | Freq: Two times a day (BID) | ORAL | Status: AC

## 2014-09-07 MED ORDER — IBUPROFEN 800 MG PO TABS: 800.0000 mg | ORAL_TABLET | Freq: Four times a day (QID) | ORAL | Status: AC | PRN

## 2014-09-07 MED ORDER — FERROUS SULFATE 325 (65 FE) MG PO TABS: 325.0000 mg | ORAL_TABLET | Freq: Two times a day (BID) | ORAL | Status: AC

## 2014-09-07 NOTE — Discharge Summary (Signed)
Obstetric Discharge Summary Reason for Admission: onset of labor Prenatal Procedures: NST and ultrasound Intrapartum Procedures: spontaneous vaginal delivery Postpartum Procedures: TDAP Complications-Operative and Postpartum: periurethral and left labial laceration with repair HEMOGLOBIN  Date Value Ref Range Status  09/07/2014 9.1* 12.0 - 16.0 g/dL Final  16/10/960410/03/2014 54.013.1 g/dL Final   HGB  Date Value Ref Range Status  06/15/2013 12.3 12.0-16.0 g/dL Final   HCT  Date Value Ref Range Status  09/07/2014 28.1* 35.0 - 47.0 % Final  02/01/2014 39 % Final  06/15/2013 36.0 35.0-47.0 % Final    Physical Exam:  General: alert, cooperative, appears stated age and no distress Lochia: appropriate Uterine Fundus: firm Incision: NA DVT Evaluation: No evidence of DVT seen on physical exam.  Discharge Diagnoses: Term Pregnancy-delivered  Discharge Information: Date: 09/07/2014 Activity: unrestricted Diet: routine Medications: PNV, Ibuprofen, Colace and Iron Condition: stable Instructions: refer to practice specific booklet Discharge to: home  Contraception: Pt plans condoms until husband gets a vasectomy.  Follow-up Information    Follow up with [facility] In 6 weeks.   Why:  for postpartum follow up   Contact information:   [phone]      Newborn Data: Live born female  Birth Weight: 7 lb 6.3 oz (3355 g) APGAR: 8, 9  Home with mother.  Yolanda Cline,  Yolanda Cline 09/07/2014, 11:16 AM

## 2014-09-07 NOTE — Anesthesia Postprocedure Evaluation (Signed)
  Anesthesia Post-op Note  Patient: Yolanda Cline  Procedure(s) Performed: * No procedures listed *  Anesthesia type:Epidural  Patient location: PACU  Post pain: Pain level controlled  Post assessment: Post-op Vital signs reviewed, Patient's Cardiovascular Status Stable, Respiratory Function Stable, Patent Airway and No signs of Nausea or vomiting  Post vital signs: Reviewed and stable  Last Vitals:  Filed Vitals:   09/07/14 0412  BP: 111/70  Pulse: 77  Temp:   Resp: 18    Level of consciousness: awake, alert  and patient cooperative  Complications: No apparent anesthesia complications

## 2023-03-22 ENCOUNTER — Emergency Department
Admission: EM | Admit: 2023-03-22 | Discharge: 2023-03-22 | Discharge status: Against Medical Advice | Payer: Self-pay | Attending: Emergency Medicine | Admitting: Emergency Medicine

## 2023-03-22 ENCOUNTER — Other Ambulatory Visit: Payer: Self-pay

## 2023-03-22 DIAGNOSIS — R197 Diarrhea, unspecified: Secondary | ICD-10-CM | POA: Insufficient documentation

## 2023-03-22 DIAGNOSIS — R109 Unspecified abdominal pain: Secondary | ICD-10-CM | POA: Insufficient documentation

## 2023-03-22 DIAGNOSIS — R112 Nausea with vomiting, unspecified: Secondary | ICD-10-CM | POA: Insufficient documentation

## 2023-03-22 DIAGNOSIS — Z5321 Procedure and treatment not carried out due to patient leaving prior to being seen by health care provider: Secondary | ICD-10-CM | POA: Insufficient documentation

## 2023-03-22 LAB — URINALYSIS, ROUTINE W REFLEX MICROSCOPIC
Bilirubin Urine: NEGATIVE
Glucose, UA: NEGATIVE mg/dL
Ketones, ur: NEGATIVE mg/dL
Leukocytes,Ua: NEGATIVE
Nitrite: NEGATIVE
Protein, ur: NEGATIVE mg/dL
Specific Gravity, Urine: 1.001 — ABNORMAL LOW (ref 1.005–1.030)
pH: 8 (ref 5.0–8.0)

## 2023-03-22 LAB — COMPREHENSIVE METABOLIC PANEL
ALT: 14 U/L (ref 0–44)
AST: 20 U/L (ref 15–41)
Albumin: 4.5 g/dL (ref 3.5–5.0)
Alkaline Phosphatase: 43 U/L (ref 38–126)
Anion gap: 9 (ref 5–15)
BUN: 10 mg/dL (ref 6–20)
CO2: 25 mmol/L (ref 22–32)
Calcium: 8.8 mg/dL — ABNORMAL LOW (ref 8.9–10.3)
Chloride: 93 mmol/L — ABNORMAL LOW (ref 98–111)
Creatinine, Ser: 0.58 mg/dL (ref 0.44–1.00)
GFR, Estimated: >60 mL/min (ref >60)
Glucose, Bld: 109 mg/dL — ABNORMAL HIGH (ref 70–99)
Potassium: 3.5 mmol/L (ref 3.5–5.1)
Sodium: 127 mmol/L — ABNORMAL LOW (ref 135–145)
Total Bilirubin: 1.2 mg/dL — ABNORMAL HIGH (ref <1.2)
Total Protein: 7.8 g/dL (ref 6.5–8.1)

## 2023-03-22 LAB — CBC
HCT: 38.5 % (ref 36.0–46.0)
Hemoglobin: 12.9 g/dL (ref 12.0–15.0)
MCH: 29.7 pg (ref 26.0–34.0)
MCHC: 33.5 g/dL (ref 30.0–36.0)
MCV: 88.5 fL (ref 80.0–100.0)
Platelets: 217 10*3/uL (ref 150–400)
RBC: 4.35 MIL/uL (ref 3.87–5.11)
RDW: 12.4 % (ref 11.5–15.5)
WBC: 8.9 10*3/uL (ref 4.0–10.5)
nRBC: 0 % (ref 0.0–0.2)

## 2023-03-22 LAB — LIPASE, BLOOD: Lipase: 23 U/L (ref 11–51)

## 2023-03-22 NOTE — ED Triage Notes (Addendum)
Pt presents with abd pain with N/V/D that started at 04:00 today. Pt states afternoon nausea subsided and she was able to keep down some water. Pt also noted after urinating she noted a pink hue on her toilet paper. Pt noted and elevated temp of 100 at home.

## 2023-03-22 NOTE — ED Notes (Signed)
Pt advised that she spoke with her PCP while in the waiting room and pt is deciding to leave and follow up with them. Pt advised she had abnormal labs and this RN strongly advised pt to complete he care. Pt states she still was going to leave and will f/u with PCP.
